# Patient Record
Sex: Female | Born: 1935 | Race: White | Hispanic: No
Health system: Southern US, Community
[De-identification: ages and names within clinical notes are randomized; demographics above are authoritative.]

---

## 2013-11-26 ENCOUNTER — Inpatient Hospital Stay
Admission: AD | Admit: 2013-11-26 | Discharge: 2013-12-30 | Disposition: E | Payer: Self-pay | Source: Intra-hospital | Attending: Internal Medicine | Admitting: Internal Medicine

## 2013-11-26 ENCOUNTER — Other Ambulatory Visit (HOSPITAL_COMMUNITY): Payer: Self-pay

## 2013-11-26 DIAGNOSIS — N493 Fournier gangrene: Secondary | ICD-10-CM

## 2013-11-26 DIAGNOSIS — J9601 Acute respiratory failure with hypoxia: Secondary | ICD-10-CM

## 2013-11-26 MED ORDER — IOHEXOL 300 MG/ML  SOLN
50.0000 mL | Freq: Once | INTRAMUSCULAR | Status: AC | PRN
  Administered 2013-11-26: 35 mL via ORAL

## 2013-11-27 ENCOUNTER — Other Ambulatory Visit (HOSPITAL_COMMUNITY): Payer: Self-pay

## 2013-11-27 DIAGNOSIS — N493 Fournier gangrene: Secondary | ICD-10-CM

## 2013-11-27 DIAGNOSIS — J9601 Acute respiratory failure with hypoxia: Secondary | ICD-10-CM

## 2013-11-27 DIAGNOSIS — J9600 Acute respiratory failure, unspecified whether with hypoxia or hypercapnia: Secondary | ICD-10-CM

## 2013-11-27 LAB — BLOOD GAS, ARTERIAL
Acid-Base Excess: 8.1 mmol/L — ABNORMAL HIGH (ref 0.0–2.0)
BICARBONATE: 30.7 meq/L — AB (ref 20.0–24.0)
Drawn by: 36496
FIO2: 0.3 %
MECHVT: 450 mL
O2 Saturation: 99.9 %
PEEP/CPAP: 5 cmH2O
PH ART: 7.583 — AB (ref 7.350–7.450)
Patient temperature: 98.6
RATE: 16 resp/min
TCO2: 31.7 mmol/L (ref 0–100)
pCO2 arterial: 32.5 mmHg — ABNORMAL LOW (ref 35.0–45.0)
pO2, Arterial: 147 mmHg — ABNORMAL HIGH (ref 80.0–100.0)

## 2013-11-27 LAB — CBC
HCT: 23.5 % — ABNORMAL LOW (ref 36.0–46.0)
Hemoglobin: 7.4 g/dL — ABNORMAL LOW (ref 12.0–15.0)
MCH: 32.2 pg (ref 26.0–34.0)
MCHC: 31.5 g/dL (ref 30.0–36.0)
MCV: 102.2 fL — ABNORMAL HIGH (ref 78.0–100.0)
Platelets: 108 10*3/uL — ABNORMAL LOW (ref 150–400)
RBC: 2.3 MIL/uL — AB (ref 3.87–5.11)
RDW: 19.5 % — ABNORMAL HIGH (ref 11.5–15.5)
WBC: 9 10*3/uL (ref 4.0–10.5)

## 2013-11-27 LAB — COMPREHENSIVE METABOLIC PANEL
ALK PHOS: 80 U/L (ref 39–117)
ALT: 12 U/L (ref 0–35)
ANION GAP: 6 (ref 5–15)
AST: 24 U/L (ref 0–37)
Albumin: 1.7 g/dL — ABNORMAL LOW (ref 3.5–5.2)
BUN: 25 mg/dL — AB (ref 6–23)
CO2: 31 meq/L (ref 19–32)
Calcium: 8.8 mg/dL (ref 8.4–10.5)
Chloride: 105 mEq/L (ref 96–112)
Creatinine, Ser: 0.33 mg/dL — ABNORMAL LOW (ref 0.50–1.10)
Glucose, Bld: 134 mg/dL — ABNORMAL HIGH (ref 70–99)
Potassium: 4.3 mEq/L (ref 3.7–5.3)
SODIUM: 142 meq/L (ref 137–147)
Total Bilirubin: 1.4 mg/dL — ABNORMAL HIGH (ref 0.3–1.2)
Total Protein: 4.9 g/dL — ABNORMAL LOW (ref 6.0–8.3)

## 2013-11-27 LAB — URINALYSIS, ROUTINE W REFLEX MICROSCOPIC
Bilirubin Urine: NEGATIVE
Glucose, UA: NEGATIVE mg/dL
Ketones, ur: NEGATIVE mg/dL
NITRITE: NEGATIVE
PROTEIN: 30 mg/dL — AB
Specific Gravity, Urine: 1.015 (ref 1.005–1.030)
Urobilinogen, UA: 4 mg/dL — ABNORMAL HIGH (ref 0.0–1.0)
pH: 8.5 — ABNORMAL HIGH (ref 5.0–8.0)

## 2013-11-27 LAB — URINE MICROSCOPIC-ADD ON

## 2013-11-27 LAB — PREALBUMIN: Prealbumin: 7.9 mg/dL — ABNORMAL LOW (ref 17.0–34.0)

## 2013-11-27 LAB — TSH: TSH: 11.31 u[IU]/mL — AB (ref 0.350–4.500)

## 2013-11-27 NOTE — Progress Notes (Signed)
[facility]                                                                                              Progress note     Patient Demographics  Angela Oneal, is a 80 y.o. female  UEA:[id number]  [id number]  DOB - 21-Sep-1930  Admit date - 2013/06/25  Admitting Physician Carron CurieAli Bobie Kistler, MD  Outpatient Primary MD for the patient is No primary provider on file.  LOS - 1   Chief complaint Respiratory failure Protein calorie malnutrition Wounds /Fournier's gangrene        Subjective:   Angela RobinsonsLillie Oneal cannot give any history  Objective:   Vital signs  Temperature 96 Heart rate 82 Respiratory rate 24 Blood pressure 138/77 Pulse ox 100%    Exam Obtunded/comatose .AT,  Stiff Neck,No JVD, No cervical lymphadenopathy appriciated. Tracheostomy midline Symmetrical Chest wall movement, scattered rhonchi, Irregularly irregular,No Gallops,Rubs systolic ejection murmur, No Parasternal Heave +ve B.Sounds, Abd Soft, generalized tenderness , No organomegaly appriciated, No rebound - guarding or rigidity. No Cyanosis, anasarca noted, multiple wounds reviewed   I&Os -410    Data Review   CBC  Recent Labs Lab 11/27/13 0820  WBC 9.0  HGB 7.4*  HCT 23.5*  PLT 108*  MCV 102.2*  MCH 32.2  MCHC 31.5  RDW 19.5*    Chemistries   Recent Labs Lab 11/27/13 0820  NA 142  K 4.3  CL 105  CO2 31  GLUCOSE 134*  BUN 25*  CREATININE 0.33*  CALCIUM 8.8  AST 24  ALT 12  ALKPHOS 80  BILITOT 1.4*   ------------------------------------------------------------------------------------------------------------------ CrCl is unknown because there is no height on file for the current visit. ------------------------------------------------------------------------------------------------------------------ No results found for this basename: HGBA1C,  in the last 72  hours ------------------------------------------------------------------------------------------------------------------ No results found for this basename: CHOL, HDL, LDLCALC, TRIG, CHOLHDL, LDLDIRECT,  in the last 72 hours ------------------------------------------------------------------------------------------------------------------  Recent Labs  11/27/13 0820  TSH 11.310*   ------------------------------------------------------------------------------------------------------------------ No results found for this basename: VITAMINB12, FOLATE, FERRITIN, TIBC, IRON, RETICCTPCT,  in the last 72 hours  Coagulation profile No results found for this basename: INR, PROTIME,  in the last 168 hours  No results found for this basename: DDIMER,  in the last 72 hours  Cardiac Enzymes No results found for this basename: CK, CKMB, TROPONINI, MYOGLOBIN,  in the last 168 hours ------------------------------------------------------------------------------------------------------------------ No components found with this basename: POCBNP,   Micro Results No results found for this or any previous visit (from the past 240 hour(s)).     Assessment & Plan   VDRF, status post tracheostomy on 7/24; complicated by pulmonary hypertension and acute lung injury due to sepsis Fournier's gangrene with involvement of the left perineum vulva right proximal medial thigh and groin status post multiple surgeries for debridement wound care team is following and wound VAC was placed Multiple decubitus ulcers on the right thigh and left ischium wound care team is following  protein calorie malnutrition/dysphagia status post PEG placement Aspiration pneumonia we'll continue with IV antibiotics UTI on treatment Status resolved with coagulopathy DIC and anemia status post blood products transfusion  A. fib with controlled rate History of congestive heart failure/diastolic with mitral  regurgitation Obesity Metabolic encephalopathy Generalized weakness PT OT might be limited due to condition Free air under diaphragm probably due to PEG tube placement and multiple surgeries. Not a surgical candidate at this time we'll follow up on an x-ray in a.m.  Plan  Continue IV antibiotics Check abdominal x-ray in am  Code Status: Full   DVT Prophylaxis  SCDs   Carron Curie M.D on 11/27/2013 at 12:19 PM

## 2013-11-27 NOTE — Consult Note (Signed)
Name: Angela Oneal MRN: [id number] DOB: 04-08-1931    ADMISSION DATE:  11/22/2013 CONSULTATION DATE:  7/30 REFERRING MD :  Laren Everts  PRIMARY SERVICE:  [facility]   CHIEF COMPLAINT:   Vent weaning   BRIEF PATIENT DESCRIPTION:  This is a MO 80 year old female who was found at home w/ AMS and multiple oozing body wounds. Per outside hospital report she was covered in stool, and clearly in a neglected state. She was admitted, her multiple wounds were assessed,  IV antibiotics and  wound care was initiated. Course was complicated by Septic shock, ARDS w/ respiratory failure, adrenal insuff, Acute encephalopathy, and DIC. Hemodynamics were eventually stabilized, but she failed weaning effort. Was transferred to [facility] for weaning efforts, wound care, antibiotics and rehab efforts.   SIGNIFICANT EVENTS / STUDIES:     HISTORY OF PRESENT ILLNESS:     80 year old female found at home w/ altered MS and numerous oozing body wounds. Admitted to [facility] for fourier gangrene involving left left perineum, vulva, and Right groin abscess. Her course was complicated by Septic shock, ARDS w/ respiratory failure, adrenal insuff, Acute encephalopathy, and DIC. He cultures from her wound grew back: staph aureus and Morganella morganii (from a right heel wound), Bacteroides caccae (beta lactamase positive) from right thigh and multiple organisms from her buttocks wound. Her hemodynamics were eventually stabalized, but she failed weaning effort. On 7/24 when she went for her wound clean-outs she also had trach and PEG placed. Wound Vac was initiated to right groin and packed Aquacel AG dressing to the vulvar left ischial wound. She remained off pressors but was profoundly deconditioned, and remained encephalopathic. Was transferred to [facility] for weaning efforts, wound care, antibiotics and rehab efforts.   PAST MEDICAL HISTORY :  Obesity  CHF Atrial fibrillation   Prior to Admission medications   pepcid 66m/d, flagyl 5067mvia  tube q8, rocephin 1 gm/d, solucortef 2533m 6, PRN albuterol neb, senna Plus bid, prednisone 24m50mbe    Allergies PCN   FAMILY HISTORY:  No family history on file. SOCIAL HISTORY:  has no tobacco, alcohol, and drug history on file.  REVIEW OF SYSTEMS:   Unable   SUBJECTIVE:  Chronically ill appearing female, currently on full vent support  VITAL SIGNS:  reviewed   PHYSICAL EXAMINATION: General:  Chronically ill appearing female. Currently on full vent support  Neuro:  Awake, does not f/c. Generalized weakness, non-focal  HEENT:  Temporal wasting. #6 trach unremarkable.  Cardiovascular:  Regular irregular  Lungs:  Scattered rhonchi  Abdomen:  PEG unremarkable. Wound vac draining and intact from right ant thigh. Abd somewhat tender to touch  Musculoskeletal:  Weak, generalized anasarca  Skin:  Multiple wounds to LE, to numerous to count left groin dressed   Recent Labs Lab 11/27/13 0820  NA 142  K 4.3  CL 105  CO2 31  BUN 25*  CREATININE 0.33*  GLUCOSE 134*    Recent Labs Lab 11/27/13 0820  HGB 7.4*  HCT 23.5*  WBC 9.0  PLT PENDING   ABG    Component Value Date/Time   PHART 7.583* 11/27/2013 0452   PCO2ART 32.5* 11/27/2013 0452   PO2ART 147.0* 11/27/2013 0452   HCO3 30.7* 11/27/2013 0452   TCO2 31.7 11/27/2013 0452   O2SAT 99.9 11/27/2013 0452    Dg Chest Port 1 View  11/27/2013   CLINICAL DATA:  Respiratory failure  EXAM: PORTABLE CHEST - 1 VIEW  COMPARISON:  Abdominal radiograph 11/24/2013  FINDINGS: Marked cardiomegaly with enlargement of the left heart and left atrial appendage. Atherosclerotic calcifications present in the transverse aorta. Pulmonary vascular congestion with cephalization and moderate pulmonary edema. Small amount of fluid present within the left major fissure. The patient is rotated toward the left. A left IJ central venous catheter is present. The catheter tip projects over the superior cavoatrial junction. Tracheostomy tube tip is midline  an well above the carina. Dense left retrocardiac opacity. Free air under the right hemidiaphragm.  IMPRESSION: 1. Large free air noted under the right hemidiaphragm. Free air was also noted on the abdominal radiograph performed yesterday. It is possible that this is related to the presence of the percutaneous gastrostomy tube. However, clinical correlation is recommended to assess for perforated viscus. 2. CHF. 3. Cardiomegaly with enlargement of the left atrial appendage and left heart. 4. Dense left retrocardiac opacity favored to reflect a combination of the cardiac and mediastinal structures given the rotated position of the patient, dependent edema and perhaps a small effusion. Superimposed infiltrate/pneumonia would be difficult to exclude entirely. 5. Satisfactory position of support apparatus as above. Critical Value/emergent results were called by telephone at the time of interpretation on 11/27/2013 at 7:56 am to the patient's nurse, Worthy Keeler , who verbally acknowledged these results.   Electronically Signed   By: Jacqulynn Cadet M.D.   On: 11/27/2013 07:57   Dg Abd Portable 1v  11/01/2013   ADDENDUM REPORT: 11/17/2013 23:53  ADDENDUM: Critical Value/emergent results were called by telephone at the time of interpretation on 10/29/2013 at 11:52 pm to Juanda Crumble, Stebbins , who verbally acknowledged these results.   Electronically Signed   By: Rozetta Nunnery M.D.   On: 11/14/2013 23:53   11/19/2013   CLINICAL DATA:  PICC placement.  EXAM: PORTABLE ABDOMEN - 1 VIEW  COMPARISON:  None.  FINDINGS: Peg tube is in the stomach. The injected contrast is in the stomach. There is some contrast in the distal colon as well. There is evidence of free air in the abdomen.  IMPRESSION: Free air in the abdomen.  Injected contrast is in the stomach.  Electronically Signed: By: Rozetta Nunnery M.D. On: 11/01/2013 23:07    ASSESSMENT / PLAN:  Resolved issues Septic shock Sepsis related coagulopathy  UTI Atrial fib    Active issues Acute respiratory failure ALI/ARDS Failure to wean  Tracheostomy status (placed 7/24) Fournier gangrene Left perineum/ vulvar area as well as right proximal/medial thigh/groin  Anemia of critical illness Acute encephalopathy  Protein cal malnutrition  Residual free air on CXR and flat plate abd 0/35: suspect this is residual from PEG and RLQ abd I&D of her complicated Obesity  Deconditioning   Discussion  This is a debilitated 80 year old female s/p a prolonged critical illness after admission for septic shock in setting of fournier gangrene c/b ALI/Ards, encephalopathy and MODS. She is profoundly deconditions, has multiple wounds and residual ALI +/- element of edema. She has failed efforts at weaning at [facility]. She is at high risk for prolonged ventilation and failure to wean.   Plan/rec: Cont abx per primary team  Cont wound care (vac for right groin and dressings to left perineal area) Begin weaning efforts, as tolerated.. Need to reduce resting RR (given resp alk) Wean protocol PS 12 start Consider diuresis (however currently bicarb > 30 may limit this) Cont nutritional support Cont rehab efforts.   May in fact need some TV reduction after further eval ideal body wt likely needs pain  control to improve rate on vent  Lavon Paganini. Titus Mould, MD, FACP Pgr: [facility]  Pulmonary and [place] Pager: [phone]  11/27/2013, 9:34 AM

## 2013-11-28 ENCOUNTER — Other Ambulatory Visit (HOSPITAL_COMMUNITY): Payer: Self-pay

## 2013-11-28 LAB — BLOOD GAS, ARTERIAL
Acid-Base Excess: 8.1 mmol/L — ABNORMAL HIGH (ref 0.0–2.0)
BICARBONATE: 31 meq/L — AB (ref 20.0–24.0)
FIO2: 0.3 %
LHR: 10 {breaths}/min
O2 SAT: 97.9 %
PEEP/CPAP: 5 cmH2O
Patient temperature: 98.6
TCO2: 32 mmol/L (ref 0–100)
VT: 400 mL
pCO2 arterial: 34.3 mmHg — ABNORMAL LOW (ref 35.0–45.0)
pH, Arterial: 7.563 — ABNORMAL HIGH (ref 7.350–7.450)
pO2, Arterial: 79.4 mmHg — ABNORMAL LOW (ref 80.0–100.0)

## 2013-11-28 NOTE — Progress Notes (Addendum)
[facility]                                                                                              Progress note     Patient Demographics  Glyn Zendejas, is a 80 y.o. female  ZOX:[id number]  [id number]  DOB - 12-May-1930  Admit date - 2013-12-03  Admitting Physician Carron Curie, MD  Outpatient Primary MD for the patient is No primary provider on file.  LOS - 2   Chief complaint Respiratory failure Protein calorie malnutrition Wounds /Fournier's gangrene        Subjective:   [name] cannot give any history  Objective:   Vital signs  Temperature 97.6 Heart rate 74 Respiratory rate 20 Blood pressure 102/60 Pulse ox 90%    Exam Obtunded/comatose [place].AT,  Stiff Neck,No JVD, No cervical lymphadenopathy appriciated. Tracheostomy midline Symmetrical Chest wall movement, scattered rhonchi, Irregularly irregular,No Gallops,Rubs systolic ejection murmur, No Parasternal Heave +ve B.Sounds, Abd Soft, generalized tenderness , No organomegaly appriciated, No rebound - guarding or rigidity. No Cyanosis, anasarca noted, multiple wounds reviewed   I&Os +282    Data Review   CBC  Recent Labs Lab 11/27/13 0820  WBC 9.0  HGB 7.4*  HCT 23.5*  PLT 108*  MCV 102.2*  MCH 32.2  MCHC 31.5  RDW 19.5*    Chemistries   Recent Labs Lab 11/27/13 0820  NA 142  K 4.3  CL 105  CO2 31  GLUCOSE 134*  BUN 25*  CREATININE 0.33*  CALCIUM 8.8  AST 24  ALT 12  ALKPHOS 80  BILITOT 1.4*   ------------------------------------------------------------------------------------------------------------------ CrCl is unknown because there is no height on file for the current visit. ------------------------------------------------------------------------------------------------------------------ No results found for this basename: HGBA1C,  in the last 72  hours ------------------------------------------------------------------------------------------------------------------ No results found for this basename: CHOL, HDL, LDLCALC, TRIG, CHOLHDL, LDLDIRECT,  in the last 72 hours ------------------------------------------------------------------------------------------------------------------  Recent Labs  11/27/13 0820  TSH 11.310*   ------------------------------------------------------------------------------------------------------------------ No results found for this basename: VITAMINB12, FOLATE, FERRITIN, TIBC, IRON, RETICCTPCT,  in the last 72 hours  Coagulation profile No results found for this basename: INR, PROTIME,  in the last 168 hours  No results found for this basename: DDIMER,  in the last 72 hours  Cardiac Enzymes No results found for this basename: CK, CKMB, TROPONINI, MYOGLOBIN,  in the last 168 hours ------------------------------------------------------------------------------------------------------------------ No components found with this basename: POCBNP,   Micro Results No results found for this or any previous visit (from the past 240 hour(s)).     Assessment & Plan   VDRF, status post tracheostomy on 7/24; complicated by pulmonary hypertension and acute lung injury due to sepsis Continue weaning per pulmonary Fournier's gangrene with involvement of the left perineum vulva right proximal medial thigh and groin status post multiple surgeries for debridement wound care team is following and wound VAC was placed Multiple decubitus ulcers on the right thigh and left ischium wound care team is following  protein calorie malnutrition/dysphagia status post PEG placement Aspiration pneumonia we'll continue with IV antibiotics UTI on treatment Status resolved with coagulopathy DIC and anemia status  post blood products transfusion A. fib with controlled rate History of congestive heart failure/diastolic with  mitral regurgitation Obesity Metabolic encephalopathy Generalized weakness PT OT might be limited due to condition Free air under diaphragm probably due to PEG tube placement and multiple surgeries. Not a surgical candidate at this time we'll follow up on an x-ray in a.m.  Plan  Check ABGs and BMP in a.m.  Code Status: Full   DVT Prophylaxis  SCDs   Carron CurieHijazi, Linzie Boursiquot M.D on 11/28/2013 at 12:33 PM

## 2013-11-28 NOTE — Consult Note (Signed)
Name: Angela Oneal MRN: [id number] DOB: [birth year]    ADMISSION DATE:  2013/12/08 CONSULTATION DATE:  7/30 REFERRING MD :  Sharyon Medicus  PRIMARY SERVICE:  [facility]   CHIEF COMPLAINT:   Vent weaning   BRIEF PATIENT DESCRIPTION:  This is a MO 80 year old female who was found at home w/ AMS and multiple oozing body wounds. Per outside hospital report she was covered in stool, and clearly in a neglected state. She was admitted, her multiple wounds were assessed,  IV antibiotics and  wound care was initiated. Course was complicated by Septic shock, ARDS w/ respiratory failure, adrenal insuff, Acute encephalopathy, and DIC. Hemodynamics were eventually stabilized, but she failed weaning effort. Was transferred to [facility] for weaning efforts, wound care, antibiotics and rehab efforts.  SIGNIFICANT EVENTS / STUDIES:  7/31 - weaning ps 12  SUBJECTIVE:  Chronically ill appearing female, currently on full vent support  VITAL SIGNS:  reviewed with weaning in progress  PHYSICAL EXAMINATION: General:  Chronically ill appearing female. Currently weaning Neuro:  Awake,Generalized weakness, non-focal  HEENT:  Temporal wasting. #6 trach unremarkable. dry Cardiovascular:  s1 s2 RRI Lungs:  Coarse throughout, distant   Abdomen:  PEG unremarkable. Wound vac draining and intact from right ant thigh. nt obese  Musculoskeletal:  Weak, generalized anasarca  Skin:  Multiple wounds to LE, to numerous to count left groin dressed   Recent Labs Lab 11/27/13 0820  NA 142  K 4.3  CL 105  CO2 31  BUN 25*  CREATININE 0.33*  GLUCOSE 134*    Recent Labs Lab 11/27/13 0820  HGB 7.4*  HCT 23.5*  WBC 9.0  PLT 108*   ABG    Component Value Date/Time   PHART 7.583* 11/27/2013 0452   PCO2ART 32.5* 11/27/2013 0452   PO2ART 147.0* 11/27/2013 0452   HCO3 30.7* 11/27/2013 0452   TCO2 31.7 11/27/2013 0452   O2SAT 99.9 11/27/2013 0452    Dg Chest Port 1 View  11/27/2013   CLINICAL DATA:  Respiratory failure  EXAM:  PORTABLE CHEST - 1 VIEW  COMPARISON:  Abdominal radiograph 12-08-13  FINDINGS: Marked cardiomegaly with enlargement of the left heart and left atrial appendage. Atherosclerotic calcifications present in the transverse aorta. Pulmonary vascular congestion with cephalization and moderate pulmonary edema. Small amount of fluid present within the left major fissure. The patient is rotated toward the left. A left IJ central venous catheter is present. The catheter tip projects over the superior cavoatrial junction. Tracheostomy tube tip is midline an well above the carina. Dense left retrocardiac opacity. Free air under the right hemidiaphragm.  IMPRESSION: 1. Large free air noted under the right hemidiaphragm. Free air was also noted on the abdominal radiograph performed yesterday. It is possible that this is related to the presence of the percutaneous gastrostomy tube. However, clinical correlation is recommended to assess for perforated viscus. 2. CHF. 3. Cardiomegaly with enlargement of the left atrial appendage and left heart. 4. Dense left retrocardiac opacity favored to reflect a combination of the cardiac and mediastinal structures given the rotated position of the patient, dependent edema and perhaps a small effusion. Superimposed infiltrate/pneumonia would be difficult to exclude entirely. 5. Satisfactory position of support apparatus as above. Critical Value/emergent results were called by telephone at the time of interpretation on 11/27/2013 at 7:56 am to the patient's nurse, Higinio Plan , who verbally acknowledged these results.   Electronically Signed   By: Malachy Moan M.D.   On: 11/27/2013 07:57   Dg  Abd Portable 1v  11/28/2013   CLINICAL DATA:  Free intraperitoneal air.  EXAM: PORTABLE ABDOMEN - 1 VIEW  COMPARISON:  Chest x-ray 11/27/2013.  KUB 11/24/2013.  FINDINGS: Soft tissue structures are unremarkable. Contrast is noted throughout the colon. Previously identified gastrostomy tube is not  visualized. Air under the right hemidiaphragm cannot be excluded as noted on prior studies. The patient's caregivers had been notified of this finding. No acute bony abnormality .  IMPRESSION: 1. Persistent and the right hemidiaphragm cannot be excluded as noted on prior studies. The patient's caregivers have been previously notified of this finding. 2. Previously identified gastrostomy tube is no longer identified. Contrast is noted throughout the colon.   Electronically Signed   By: Maisie Fushomas  Register   On: 11/28/2013 07:45   Dg Abd Portable 1v  10/31/2013   ADDENDUM REPORT: 11/14/2013 23:53  ADDENDUM: Critical Value/emergent results were called by telephone at the time of interpretation on 11/15/2013 at 11:52 pm to Leonette Mostharles, RN , who verbally acknowledged these results.   Electronically Signed   By: Geanie CooleyJim  Maxwell M.D.   On: 11/17/2013 23:53   11/10/2013   CLINICAL DATA:  PICC placement.  EXAM: PORTABLE ABDOMEN - 1 VIEW  COMPARISON:  None.  FINDINGS: Peg tube is in the stomach. The injected contrast is in the stomach. There is some contrast in the distal colon as well. There is evidence of free air in the abdomen.  IMPRESSION: Free air in the abdomen.  Injected contrast is in the stomach.  Electronically Signed: By: Geanie CooleyJim  Maxwell M.D. On: 10/29/2013 23:07    ASSESSMENT / PLAN:  Resolved issues Septic shock Sepsis related coagulopathy  UTI Atrial fib   Active issues Acute respiratory failure ALI/ARDS Failure to wean  Tracheostomy status (placed 7/24) Fournier gangrene Left perineum/ vulvar area as well as right proximal/medial thigh/groin  Anemia of critical illness Acute encephalopathy  Protein cal malnutrition  Residual free air on CXR and flat plate abd 1/617/30: suspect this is residual from PEG and RLQ abd I&D of her complicated Obesity  Deconditioning   Discussion  This is a debilitated 80 year old female s/p a prolonged critical illness after admission for septic shock in setting of  fournier gangrene c/b ALI/Ards, encephalopathy and MODS. She is profoundly deconditions, has multiple wounds and residual ALI +/- element of edema. She [facility]. She is at high risk for prolonged ventilation and failure to wean.   Plan/rec: Weaning ps 12 cpap 5, goal 4-6 hours Was alkalotic yesterday, rate reduced, ABG when back on rest and assess needs for TV reduction to 350 given ideal body wt likely needs pain control to improve rate on vent Would suggest pcxr Monday, also to follow free air post PEG for resolution should not have free air at 2 weeks post peg Repeat chem with active diuresis to [employer]  Daniel J. Tyson AliasFeinstein, MD, FACP Pgr: [phone] [place] [facility]  Pulmonary and Critical Care Medicine [facility] Pager: [phone]  11/28/2013, 10:01 AM

## 2013-11-29 ENCOUNTER — Other Ambulatory Visit (HOSPITAL_COMMUNITY): Payer: Self-pay

## 2013-11-29 LAB — BLOOD GAS, ARTERIAL
Acid-Base Excess: 9 mmol/L — ABNORMAL HIGH (ref 0.0–2.0)
Bicarbonate: 32.2 mEq/L — ABNORMAL HIGH (ref 20.0–24.0)
FIO2: 0.3 %
LHR: 10 {breaths}/min
MECHVT: 350 mL
O2 Saturation: 100 %
PCO2 ART: 36.2 mmHg (ref 35.0–45.0)
PEEP: 5 cmH2O
Patient temperature: 97.9
TCO2: 33.3 mmol/L (ref 0–100)
pH, Arterial: 7.556 — ABNORMAL HIGH (ref 7.350–7.450)
pO2, Arterial: 163 mmHg — ABNORMAL HIGH (ref 80.0–100.0)

## 2013-11-29 LAB — BASIC METABOLIC PANEL
Anion gap: 7 (ref 5–15)
BUN: 29 mg/dL — ABNORMAL HIGH (ref 6–23)
CALCIUM: 8.4 mg/dL (ref 8.4–10.5)
CO2: 30 mEq/L (ref 19–32)
Chloride: 104 mEq/L (ref 96–112)
Creatinine, Ser: 0.33 mg/dL — ABNORMAL LOW (ref 0.50–1.10)
GFR calc Af Amer: >90 mL/min (ref >90)
GLUCOSE: 137 mg/dL — AB (ref 70–99)
Potassium: 4.4 mEq/L (ref 3.7–5.3)
SODIUM: 141 meq/L (ref 137–147)

## 2013-11-29 MED ORDER — IOHEXOL 300 MG/ML  SOLN
150.0000 mL | Freq: Once | INTRAMUSCULAR | Status: AC | PRN
  Administered 2013-11-29: 150 mL via ORAL

## 2013-11-29 DEATH — deceased

## 2013-12-01 ENCOUNTER — Other Ambulatory Visit (HOSPITAL_COMMUNITY): Payer: Self-pay

## 2013-12-01 LAB — BASIC METABOLIC PANEL
Anion gap: 4 — ABNORMAL LOW (ref 5–15)
BUN: 33 mg/dL — AB (ref 6–23)
CO2: 32 mEq/L (ref 19–32)
CREATININE: 0.29 mg/dL — AB (ref 0.50–1.10)
Calcium: 8.2 mg/dL — ABNORMAL LOW (ref 8.4–10.5)
Chloride: 105 mEq/L (ref 96–112)
GFR calc Af Amer: >90 mL/min (ref >90)
GLUCOSE: 120 mg/dL — AB (ref 70–99)
Potassium: 4.3 mEq/L (ref 3.7–5.3)
Sodium: 141 mEq/L (ref 137–147)

## 2013-12-01 LAB — CBC
HEMATOCRIT: 22.5 % — AB (ref 36.0–46.0)
Hemoglobin: 7 g/dL — ABNORMAL LOW (ref 12.0–15.0)
MCH: 32.7 pg (ref 26.0–34.0)
MCHC: 31.1 g/dL (ref 30.0–36.0)
MCV: 105.1 fL — AB (ref 78.0–100.0)
Platelets: 104 10*3/uL — ABNORMAL LOW (ref 150–400)
RBC: 2.14 MIL/uL — ABNORMAL LOW (ref 3.87–5.11)
RDW: 19.9 % — AB (ref 11.5–15.5)
WBC: 8.7 10*3/uL (ref 4.0–10.5)

## 2013-12-01 LAB — ABO/RH: ABO/RH(D): O NEG

## 2013-12-01 LAB — PREPARE RBC (CROSSMATCH)
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
## 2013-12-01 NOTE — Progress Notes (Signed)
[facility]                                                                                              Progress note     Patient Demographics  Angela RobinsonsLillie Stradford, is a 80 y.o. female  ZOX:[id number]  WJX:[id number]  DOB - 1930-11-06  Admit date - 11/09/2013  Admitting Physician Carron CurieAli Kage Willmann, MD  Outpatient Primary MD for the patient is No primary provider on file.  LOS - 5   Chief complaint Respiratory failure Protein calorie malnutrition Wounds /Fournier's gangrene        Subjective:   Angela Oneal cannot give any history  Objective:   Vital signs  Temperature 98.1 Heart rate 100 Respiratory rate 34 Blood pressure 180/69 was normal earlier will monitor Pulse ox 98%    Exam Obtunded/comatose [place].AT,  Stiff Neck,No JVD, No cervical lymphadenopathy appriciated. Tracheostomy midline Symmetrical Chest wall movement, scattered rhonchi, Irregularly irregular,No Gallops,Rubs systolic ejection murmur, No Parasternal Heave +ve B.Sounds, Abd Soft, generalized tenderness , No organomegaly appriciated, No rebound - guarding or rigidity. No Cyanosis, anasarca noted, multiple wounds reviewed   I&Os -490    Data Review   CBC  Recent Labs Lab 11/27/13 0820 12/01/13 0555  WBC 9.0 8.7  HGB 7.4* 7.0*  HCT 23.5* 22.5*  PLT 108* 104*  MCV 102.2* 105.1*  MCH 32.2 32.7  MCHC 31.5 31.1  RDW 19.5* 19.9*    Chemistries   Recent Labs Lab 11/27/13 0820 11/29/13 0520 12/01/13 0555  NA 142 141 141  K 4.3 4.4 4.3  CL 105 104 105  CO2 31 30 32  GLUCOSE 134* 137* 120*  BUN 25* 29* 33*  CREATININE 0.33* 0.33* 0.29*  CALCIUM 8.8 8.4 8.2*  AST 24  --   --   ALT 12  --   --   ALKPHOS 80  --   --   BILITOT 1.4*  --   --    ------------------------------------------------------------------------------------------------------------------ CrCl is unknown because there is no height on file  for the current visit. ------------------------------------------------------------------------------------------------------------------ No results found for this basename: HGBA1C,  in the last 72 hours ------------------------------------------------------------------------------------------------------------------ No results found for this basename: CHOL, HDL, LDLCALC, TRIG, CHOLHDL, LDLDIRECT,  in the last 72 hours ------------------------------------------------------------------------------------------------------------------ No results found for this basename: TSH, T4TOTAL, FREET3, T3FREE, THYROIDAB,  in the last 72 hours ------------------------------------------------------------------------------------------------------------------ No results found for this basename: VITAMINB12, FOLATE, FERRITIN, TIBC, IRON, RETICCTPCT,  in the last 72 hours  Coagulation profile No results found for this basename: INR, PROTIME,  in the last 168 hours  No results found for this basename: DDIMER,  in the last 72 hours  Cardiac Enzymes No results found for this basename: CK, CKMB, TROPONINI, MYOGLOBIN,  in the last 168 hours ------------------------------------------------------------------------------------------------------------------ No components found with this basename: POCBNP,   Micro Results No results found for this or any previous visit (from the past 240 hour(s)).     Assessment & Plan   VDRF, status post tracheostomy on 7/24; complicated by pulmonary hypertension and acute lung injury due to sepsis Continue weaning per pulmonary Fournier's gangrene with involvement of the left  perineum vulva right proximal medial thigh and groin status post multiple surgeries for debridement wound care team is following and wound VAC was placed Multiple decubitus ulcers on the right thigh and left ischium wound care team is following  protein calorie malnutrition/dysphagia status post PEG  placement Aspiration pneumonia we'll continue with IV antibiotics UTI on treatment Status resolved with coagulopathy DIC and anemia status post blood products transfusion A. fib with controlled rate History of congestive heart failure/diastolic with mitral regurgitation Obesity Metabolic encephalopathy Generalized weakness PT OT might be limited due to condition Free air under diaphragm probably due to PEG tube placement and multiple surgeries. Patient is asymptomatic. Not a surgical candidate at this time we'll follow up on an x-ray in a.m. Anemia  Plan  Blood transfusion Continue weaning . Follow abdominal free air  Code Status: Full   DVT Prophylaxis  SCDs   Carron Curie M.D on 12/01/2013 at 11:09 AM

## 2013-12-02 LAB — CBC
HEMATOCRIT: 25.9 % — AB (ref 36.0–46.0)
Hemoglobin: 8.3 g/dL — ABNORMAL LOW (ref 12.0–15.0)
MCH: 32.4 pg (ref 26.0–34.0)
MCHC: 32 g/dL (ref 30.0–36.0)
MCV: 101.2 fL — ABNORMAL HIGH (ref 78.0–100.0)
Platelets: 113 10*3/uL — ABNORMAL LOW (ref 150–400)
RBC: 2.56 MIL/uL — ABNORMAL LOW (ref 3.87–5.11)
RDW: 22.1 % — AB (ref 11.5–15.5)
WBC: 9.5 10*3/uL (ref 4.0–10.5)

## 2013-12-02 NOTE — Progress Notes (Addendum)
[facility]                                                                                              Progress note     Patient Demographics  Angela Oneal, is a 80 y.o. female  KKX:[id number]  [id number]  DOB - 04/19/1931  Admit date - 11-29-13  Admitting Physician Angela Curie, MD  Outpatient Primary MD for the patient is No primary provider on file.  LOS - 6   Chief complaint Respiratory failure Protein calorie malnutrition Wounds /Fournier's gangrene        Subjective:   Angela Oneal cannot give any history  Objective:   Vital signs  Temperature 96.9 Heart rate 70 Respiratory rate 19 Blood pressure 131/64 Pulse ox 97%    Exam Obtunded/comatose [place].AT,  Stiff Neck,No JVD, No cervical lymphadenopathy appriciated. Tracheostomy midline Symmetrical Chest wall movement, scattered rhonchi, Irregularly irregular,No Gallops,Rubs systolic ejection murmur, No Parasternal Heave +ve B.Sounds, Abd Soft, generalized tenderness , No organomegaly appriciated, No rebound - guarding or rigidity. No Cyanosis, anasarca noted, multiple wounds reviewed   I&Os - 720    Data Review   CBC  Recent Labs Lab 11/27/13 0820 12/01/13 0555  WBC 9.0 8.7  HGB 7.4* 7.0*  HCT 23.5* 22.5*  PLT 108* 104*  MCV 102.2* 105.1*  MCH 32.2 32.7  MCHC 31.5 31.1  RDW 19.5* 19.9*    Chemistries   Recent Labs Lab 11/27/13 0820 11/29/13 0520 12/01/13 0555  NA 142 141 141  K 4.3 4.4 4.3  CL 105 104 105  CO2 31 30 32  GLUCOSE 134* 137* 120*  BUN 25* 29* 33*  CREATININE 0.33* 0.33* 0.29*  CALCIUM 8.8 8.4 8.2*  AST 24  --   --   ALT 12  --   --   ALKPHOS 80  --   --   BILITOT 1.4*  --   --    ------------------------------------------------------------------------------------------------------------------ CrCl is unknown because there is no height on file for the current  visit. ------------------------------------------------------------------------------------------------------------------ No results found for this basename: HGBA1C,  in the last 72 hours ------------------------------------------------------------------------------------------------------------------ No results found for this basename: CHOL, HDL, LDLCALC, TRIG, CHOLHDL, LDLDIRECT,  in the last 72 hours ------------------------------------------------------------------------------------------------------------------ No results found for this basename: TSH, T4TOTAL, FREET3, T3FREE, THYROIDAB,  in the last 72 hours ------------------------------------------------------------------------------------------------------------------ No results found for this basename: VITAMINB12, FOLATE, FERRITIN, TIBC, IRON, RETICCTPCT,  in the last 72 hours  Coagulation profile No results found for this basename: INR, PROTIME,  in the last 168 hours  No results found for this basename: DDIMER,  in the last 72 hours  Cardiac Enzymes No results found for this basename: CK, CKMB, TROPONINI, MYOGLOBIN,  in the last 168 hours ------------------------------------------------------------------------------------------------------------------ No components found with this basename: POCBNP,   Micro Results No results found for this or any previous visit (from the past 240 hour(s)).     Assessment & Plan   VDRF, status post tracheostomy on 7/24; complicated by pulmonary hypertension and acute lung injury due to sepsis Continue PSV weaning per pulmonary Fournier's gangrene with involvement of the left perineum vulva right  proximal medial thigh and groin status post multiple surgeries for debridement wound care team is following and wound VAC was placed Multiple decubitus ulcers on the right thigh and left ischium wound care team is following  protein calorie malnutrition/dysphagia status post PEG placement Aspiration  pneumonia we'll continue with IV antibiotics UTI on treatment Status resolved with coagulopathy DIC and anemia status post blood products transfusion A. fib with controlled rate History of congestive heart failure/diastolic with mitral regurgitation Obesity Metabolic encephalopathy Generalized weakness PT OT might be limited due to condition Free air under diaphragm probably due to PEG tube placement and multiple surgeries. Patient is asymptomatic. Not a surgical candidate at this time we'll follow up on an x-ray in a.m. Anemia  Plan  Continue same treatment Check CBC stat Critical care time 37 minutes  Code Status: Full   DVT Prophylaxis  SCDs   Angela CurieHijazi, Angela Oneal M.D on 12/02/2013 at 11:58 AM

## 2013-12-03 ENCOUNTER — Other Ambulatory Visit (HOSPITAL_COMMUNITY): Payer: Self-pay

## 2013-12-03 LAB — TYPE AND SCREEN
ABO/RH(D): O NEG
Antibody Screen: NEGATIVE
UNIT DIVISION: 0
Unit division: 0

## 2013-12-03 LAB — CBC
HCT: 25.8 % — ABNORMAL LOW (ref 36.0–46.0)
HEMOGLOBIN: 8.2 g/dL — AB (ref 12.0–15.0)
MCH: 32.5 pg (ref 26.0–34.0)
MCHC: 31.8 g/dL (ref 30.0–36.0)
MCV: 102.4 fL — ABNORMAL HIGH (ref 78.0–100.0)
Platelets: 112 10*3/uL — ABNORMAL LOW (ref 150–400)
RBC: 2.52 MIL/uL — AB (ref 3.87–5.11)
RDW: 22.1 % — ABNORMAL HIGH (ref 11.5–15.5)
WBC: 8.9 10*3/uL (ref 4.0–10.5)

## 2013-12-03 NOTE — Progress Notes (Signed)
[facility]                                                                                              Progress note     Patient Demographics  Angela Oneal, is a 80 y.o. female  ZOX:[id number]  WJX:[id number]  DOB - 1930-08-16  Admit date - Dec 19, 2013  Admitting Physician Carron Curie, MD  Outpatient Primary MD for the patient is No primary provider on file.  LOS - 7   Chief complaint Respiratory failure Protein calorie malnutrition Wounds /Fournier's gangrene        Subjective:   Angela Oneal cannot give any history  Objective:   Vital signs  Temperature 97.3 Heart rate 76 Respiratory rate 19 Blood pressure 113/66 Pulse ox 97%    Exam More alert still confused Angela Oneal.AT,  Stiff Neck,No JVD, No cervical lymphadenopathy appriciated. Tracheostomy midline Symmetrical Chest wall movement, scattered rhonchi, Irregularly irregular,No Gallops,Rubs systolic ejection murmur, No Parasternal Heave +ve B.Sounds, Abd Soft, generalized tenderness , No organomegaly appriciated, No rebound - guarding or rigidity. No Cyanosis, anasarca noted, multiple wounds reviewed   I&Os - 561    Data Review   CBC  Recent Labs Lab 11/27/13 0820 12/01/13 0555 12/02/13 1120 12/03/13 0500  WBC 9.0 8.7 9.5 8.9  HGB 7.4* 7.0* 8.3* 8.2*  HCT 23.5* 22.5* 25.9* 25.8*  PLT 108* 104* 113* 112*  MCV 102.2* 105.1* 101.2* 102.4*  MCH 32.2 32.7 32.4 32.5  MCHC 31.5 31.1 32.0 31.8  RDW 19.5* 19.9* 22.1* 22.1*    Chemistries   Recent Labs Lab 11/27/13 0820 11/29/13 0520 12/01/13 0555  NA 142 141 141  K 4.3 4.4 4.3  CL 105 104 105  CO2 31 30 32  GLUCOSE 134* 137* 120*  BUN 25* 29* 33*  CREATININE 0.33* 0.33* 0.29*  CALCIUM 8.8 8.4 8.2*  AST 24  --   --   ALT 12  --   --   ALKPHOS 80  --   --   BILITOT 1.4*  --   --     ------------------------------------------------------------------------------------------------------------------ CrCl is unknown because there is no height on file for the current visit. ------------------------------------------------------------------------------------------------------------------ No results found for this basename: HGBA1C,  in the last 72 hours ------------------------------------------------------------------------------------------------------------------ No results found for this basename: CHOL, HDL, LDLCALC, TRIG, CHOLHDL, LDLDIRECT,  in the last 72 hours ------------------------------------------------------------------------------------------------------------------ No results found for this basename: TSH, T4TOTAL, FREET3, T3FREE, THYROIDAB,  in the last 72 hours ------------------------------------------------------------------------------------------------------------------ No results found for this basename: VITAMINB12, FOLATE, FERRITIN, TIBC, IRON, RETICCTPCT,  in the last 72 hours  Coagulation profile No results found for this basename: INR, PROTIME,  in the last 168 hours  No results found for this basename: DDIMER,  in the last 72 hours  Cardiac Enzymes No results found for this basename: CK, CKMB, TROPONINI, MYOGLOBIN,  in the last 168 hours ------------------------------------------------------------------------------------------------------------------ No components found with this basename: POCBNP,   Micro Results No results found for this or any previous visit (from the past 240 hour(s)).     Assessment & Plan   VDRF, status post tracheostomy on 7/24; complicated by pulmonary  hypertension and acute lung injury due to sepsis Continue ATC weaning Fournier's gangrene with involvement of the left perineum vulva right proximal medial thigh and groin status post multiple surgeries for debridement wound care team is following and wound VAC was  placed Multiple decubitus ulcers on the right thigh and left ischium wound care team is following  protein calorie malnutrition/dysphagia status post PEG placement Aspiration pneumonia we'll continue with IV antibiotics UTI on treatment Status resolved with coagulopathy DIC and anemia status post blood products transfusion A. fib with controlled rate History of congestive heart failure/diastolic with mitral regurgitation Obesity Metabolic encephalopathy Generalized weakness PT OT might be limited due to condition Free air under diaphragm probably due to PEG tube placement and multiple surgeries. Patient is asymptomatic. Not a surgical candidate at this time we'll follow up on an x-ray in a.m. Anemia  Plan  Check labs in a.m. ATC 24 hours today Consult surgery  Code Status: Full   DVT Prophylaxis  SCDs   Carron CurieHijazi, Nusrat Encarnacion M.D on 12/03/2013 at 12:19 PM

## 2013-12-04 LAB — BASIC METABOLIC PANEL
Anion gap: 7 (ref 5–15)
BUN: 40 mg/dL — ABNORMAL HIGH (ref 6–23)
CALCIUM: 8.3 mg/dL — AB (ref 8.4–10.5)
CHLORIDE: 102 meq/L (ref 96–112)
CO2: 31 meq/L (ref 19–32)
Creatinine, Ser: 0.29 mg/dL — ABNORMAL LOW (ref 0.50–1.10)
GFR calc Af Amer: >90 mL/min (ref >90)
GFR calc non Af Amer: >90 mL/min (ref >90)
Glucose, Bld: 139 mg/dL — ABNORMAL HIGH (ref 70–99)
Potassium: 4.2 mEq/L (ref 3.7–5.3)
SODIUM: 140 meq/L (ref 137–147)

## 2013-12-04 LAB — CBC
HEMATOCRIT: 26.4 % — AB (ref 36.0–46.0)
Hemoglobin: 8.4 g/dL — ABNORMAL LOW (ref 12.0–15.0)
MCH: 32.2 pg (ref 26.0–34.0)
MCHC: 31.8 g/dL (ref 30.0–36.0)
MCV: 101.1 fL — ABNORMAL HIGH (ref 78.0–100.0)
Platelets: 129 10*3/uL — ABNORMAL LOW (ref 150–400)
RBC: 2.61 MIL/uL — ABNORMAL LOW (ref 3.87–5.11)
RDW: 21.5 % — AB (ref 11.5–15.5)
WBC: 10.2 10*3/uL (ref 4.0–10.5)
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
## 2013-12-04 NOTE — Progress Notes (Signed)
[facility]                                                                                              Progress note     Patient Demographics  Angela Oneal, is a 80 y.o. female  ZOX:[id number]  WJX:[id number]  DOB - [birth year]  Admit date - 10/31/2013  Admitting Physician Carron CurieAli Mavrik Bynum, MD  Outpatient Primary MD for the patient is No primary provider on file.  LOS - 8   Chief complaint Respiratory failure Protein calorie malnutrition Wounds /Fournier's gangrene        Subjective:   Angela RobinsonsLillie Gadea cannot give any history  Objective:   Vital signs  Temperature 98 Heart rate 67 Respiratory rate 19 Blood pressure 121/67 Pulse ox 100%    Exam  More alert still confused [place].AT,  Stiff Neck,No JVD, No cervical lymphadenopathy appriciated. Tracheostomy midline Symmetrical Chest wall movement, scattered rhonchi, Irregularly irregular,No Gallops,Rubs systolic ejection murmur, No Parasternal Heave +ve B.Sounds, Abd Soft, generalized tenderness , No organomegaly appriciated, No rebound - guarding or rigidity. No Cyanosis, anasarca noted, multiple wounds reviewed   I&Os 97    Data Review   CBC  Recent Labs Lab 12/01/13 0555 12/02/13 1120 12/03/13 0500 12/04/13 0500  WBC 8.7 9.5 8.9 10.2  HGB 7.0* 8.3* 8.2* 8.4*  HCT 22.5* 25.9* 25.8* 26.4*  PLT 104* 113* 112* 129*  MCV 105.1* 101.2* 102.4* 101.1*  MCH 32.7 32.4 32.5 32.2  MCHC 31.1 32.0 31.8 31.8  RDW 19.9* 22.1* 22.1* 21.5*    Chemistries   Recent Labs Lab 11/29/13 0520 12/01/13 0555 12/04/13 0500  NA 141 141 140  K 4.4 4.3 4.2  CL 104 105 102  CO2 30 32 31  GLUCOSE 137* 120* 139*  BUN 29* 33* 40*  CREATININE 0.33* 0.29* 0.29*  CALCIUM 8.4 8.2* 8.3*   ------------------------------------------------------------------------------------------------------------------ CrCl is unknown because there is no height on  file for the current visit. ------------------------------------------------------------------------------------------------------------------ No results found for this basename: HGBA1C,  in the last 72 hours ------------------------------------------------------------------------------------------------------------------ No results found for this basename: CHOL, HDL, LDLCALC, TRIG, CHOLHDL, LDLDIRECT,  in the last 72 hours ------------------------------------------------------------------------------------------------------------------ No results found for this basename: TSH, T4TOTAL, FREET3, T3FREE, THYROIDAB,  in the last 72 hours ------------------------------------------------------------------------------------------------------------------ No results found for this basename: VITAMINB12, FOLATE, FERRITIN, TIBC, IRON, RETICCTPCT,  in the last 72 hours  Coagulation profile No results found for this basename: INR, PROTIME,  in the last 168 hours  No results found for this basename: DDIMER,  in the last 72 hours  Cardiac Enzymes No results found for this basename: CK, CKMB, TROPONINI, MYOGLOBIN,  in the last 168 hours ------------------------------------------------------------------------------------------------------------------ No components found with this basename: POCBNP,   Micro Results No results found for this or any previous visit (from the past 240 hour(s)).     Assessment & Plan   VDRF, status post tracheostomy on 7/24; complicated by pulmonary hypertension and acute lung injury due to sepsis Continue ATC weaning Fournier's gangrene with involvement of the left perineum vulva right proximal medial thigh and groin status post multiple surgeries for debridement wound care team is  following and wound VAC was placed wounds look improved Multiple decubitus ulcers on the right thigh and left ischium wound care team is following ulcers look improved  protein calorie  malnutrition/dysphagia status post PEG placement Aspiration pneumonia we'll continue with IV antibiotics UTI on treatment Status resolved with coagulopathy DIC and anemia status post blood products transfusion A. fib with controlled rate History of congestive heart failure/diastolic with mitral regurgitation Obesity Metabolic encephalopathy Generalized weakness PT OT might be limited due to condition Free air under diaphragm probably due to PEG tube placement and multiple surgeries. Patient is asymptomatic. Not a surgical candidate at this time we'll follow up on an x-ray in a.m. Anemia  Plan  Continue same treatment No change  Code Status: Full   DVT Prophylaxis  SCDs   Carron Curie M.D on 12/04/2013 at 2:15 PM

## 2013-12-05 NOTE — Progress Notes (Addendum)
[facility]                                                                                              Progress note     Patient Demographics  Angela Oneal, is a 80 y.o. female  JXB:[id number]  ZHY:[id number]  DOB - [birth year]  Admit date - 01-26-2014  Admitting Physician Carron CurieAli Jad Johansson, MD  Outpatient Primary MD for the patient is No primary provider on file.  LOS - 9   Chief complaint Respiratory failure Protein calorie malnutrition Wounds /Fournier's gangrene        Subjective:   Angela Oneal cannot give any history  Objective:   Vital signs  Temperature 98.9 Heart rate 65 Respiratory rate 22 Blood pressure 142/72 Pulse ox 100%    Exam  More alert still confused [place].AT,  Stiff Neck,No JVD, No cervical lymphadenopathy appriciated. Tracheostomy midline Symmetrical Chest wall movement, scattered rhonchi, Irregularly irregular,No Gallops,Rubs systolic ejection murmur, No Parasternal Heave +ve B.Sounds, Abd Soft, generalized tenderness , No organomegaly appriciated, No rebound - guarding or rigidity. No Cyanosis, anasarca noted, multiple wounds reviewed   I&Os 598    Data Review   CBC  Recent Labs Lab 12/01/13 0555 12/02/13 1120 12/03/13 0500 12/04/13 0500  WBC 8.7 9.5 8.9 10.2  HGB 7.0* 8.3* 8.2* 8.4*  HCT 22.5* 25.9* 25.8* 26.4*  PLT 104* 113* 112* 129*  MCV 105.1* 101.2* 102.4* 101.1*  MCH 32.7 32.4 32.5 32.2  MCHC 31.1 32.0 31.8 31.8  RDW 19.9* 22.1* 22.1* 21.5*    Chemistries   Recent Labs Lab 11/29/13 0520 12/01/13 0555 12/04/13 0500  NA 141 141 140  K 4.4 4.3 4.2  CL 104 105 102  CO2 30 32 31  GLUCOSE 137* 120* 139*  BUN 29* 33* 40*  CREATININE 0.33* 0.29* 0.29*  CALCIUM 8.4 8.2* 8.3*   ------------------------------------------------------------------------------------------------------------------ CrCl is unknown because there is no height  on file for the current visit. ------------------------------------------------------------------------------------------------------------------ No results found for this basename: HGBA1C,  in the last 72 hours ------------------------------------------------------------------------------------------------------------------ No results found for this basename: CHOL, HDL, LDLCALC, TRIG, CHOLHDL, LDLDIRECT,  in the last 72 hours ------------------------------------------------------------------------------------------------------------------ No results found for this basename: TSH, T4TOTAL, FREET3, T3FREE, THYROIDAB,  in the last 72 hours ------------------------------------------------------------------------------------------------------------------ No results found for this basename: VITAMINB12, FOLATE, FERRITIN, TIBC, IRON, RETICCTPCT,  in the last 72 hours  Coagulation profile No results found for this basename: INR, PROTIME,  in the last 168 hours  No results found for this basename: DDIMER,  in the last 72 hours  Cardiac Enzymes No results found for this basename: CK, CKMB, TROPONINI, MYOGLOBIN,  in the last 168 hours ------------------------------------------------------------------------------------------------------------------ No components found with this basename: POCBNP,   Micro Results No results found for this or any previous visit (from the past 240 hour(s)).     Assessment & Plan   VDRF, status post tracheostomy on 7/24; complicated by pulmonary hypertension and acute lung injury due to sepsis Continue ATC  Fournier's gangrene with involvement of the left perineum vulva right proximal medial thigh and groin status post multiple surgeries for debridement wound care team is  following and wound VAC was placed wounds look improved Multiple decubitus ulcers on the right thigh and left ischium wound care team is following ulcers look improved  protein calorie  malnutrition/dysphagia status post PEG placement Aspiration pneumonia we'll continue with IV antibiotics UTI on treatment Status resolved with coagulopathy DIC and anemia status post blood products transfusion A. fib with controlled rate History of congestive heart failure/diastolic with mitral regurgitation Obesity Metabolic encephalopathy Generalized weakness PT OT might be limited due to condition Free air under diaphragm probably due to PEG tube placement and multiple surgeries. Patient is asymptomatic. Not a surgical candidate at this time we'll follow up on an x-ray in a.m. Anemia  Plan  Continue same treatment No change Critical care time 31 minutes Code Status: Full   DVT Prophylaxis  SCDs   Carron Curie M.D on 12/05/2013 at 11:41 AM

## 2013-12-06 NOTE — Progress Notes (Signed)
[facility]                                                                                              Progress note     Patient Demographics  Angela Oneal, is a 80 y.o. female  ZOX:[id number]  [id number]:[id number]  DOB - 1930-12-18  Admit date - 11/23/2013  Admitting Physician Angela Curie, MD  Outpatient Primary MD for the patient is No primary provider on file.  LOS - 10   Chief complaint Respiratory failure Protein calorie malnutrition Wounds /Fournier's gangrene        Subjective:   Angela Oneal cannot give any history  Objective:   Vital signs  Temperature 97.1 Heart rate is 60 Respiratory rate 20 Blood pressure 112/60 Pulse ox 100%    Exam  More alert still confused [place].AT,  Stiff Neck,No JVD, No cervical lymphadenopathy appriciated. Tracheostomy midline Symmetrical Chest wall movement, scattered rhonchi, Regular rate and rhythm ,No Gallops,Rubs systolic ejection murmur, No Parasternal Heave +ve B.Sounds, Abd Soft, generalized tenderness , No organomegaly appriciated, No rebound - guarding or rigidity. No Cyanosis, anasarca noted, multiple wounds reviewed   I&Os 2060/210    Data Review   CBC  Recent Labs Lab 12/01/13 0555 12/02/13 1120 12/03/13 0500 12/04/13 0500  WBC 8.7 9.5 8.9 10.2  HGB 7.0* 8.3* 8.2* 8.4*  HCT 22.5* 25.9* 25.8* 26.4*  PLT 104* 113* 112* 129*  MCV 105.1* 101.2* 102.4* 101.1*  MCH 32.7 32.4 32.5 32.2  MCHC 31.1 32.0 31.8 31.8  RDW 19.9* 22.1* 22.1* 21.5*    Chemistries   Recent Labs Lab 12/01/13 0555 12/04/13 0500  NA 141 140  K 4.3 4.2  CL 105 102  CO2 32 31  GLUCOSE 120* 139*  BUN 33* 40*  CREATININE 0.29* 0.29*  CALCIUM 8.2* 8.3*   ------------------------------------------------------------------------------------------------------------------ CrCl is unknown because there is no height on file for the current  visit. ------------------------------------------------------------------------------------------------------------------ No results found for this basename: HGBA1C,  in the last 72 hours ------------------------------------------------------------------------------------------------------------------ No results found for this basename: CHOL, HDL, LDLCALC, TRIG, CHOLHDL, LDLDIRECT,  in the last 72 hours ------------------------------------------------------------------------------------------------------------------ No results found for this basename: TSH, T4TOTAL, FREET3, T3FREE, THYROIDAB,  in the last 72 hours ------------------------------------------------------------------------------------------------------------------ No results found for this basename: VITAMINB12, FOLATE, FERRITIN, TIBC, IRON, RETICCTPCT,  in the last 72 hours  Coagulation profile No results found for this basename: INR, PROTIME,  in the last 168 hours  No results found for this basename: DDIMER,  in the last 72 hours  Cardiac Enzymes No results found for this basename: CK, CKMB, TROPONINI, MYOGLOBIN,  in the last 168 hours ------------------------------------------------------------------------------------------------------------------ No components found with this basename: POCBNP,   Micro Results No results found for this or any previous visit (from the past 240 hour(s)).     Assessment & Plan   VDRF, status post tracheostomy on 7/24; complicated by pulmonary hypertension and acute lung injury due to sepsis Continue ATC  Fournier's gangrene with involvement of the left perineum vulva right proximal medial thigh and groin status post multiple surgeries for debridement wound care team is following and wound VAC was placed  wounds look improved Multiple decubitus ulcers on the right thigh and left ischium wound care team is following ulcers look improved  protein calorie malnutrition/dysphagia status post PEG  placement Aspiration pneumonia we'll continue with IV antibiotics UTI on treatment Status resolved with coagulopathy DIC and anemia status post blood products transfusion A. fib with controlled rate History of congestive heart failure/diastolic with mitral regurgitation Obesity Metabolic encephalopathy Generalized weakness PT OT might be limited due to condition Free air under diaphragm probably due to PEG tube placement and multiple surgeries. Patient is asymptomatic. Not a surgical candidate at this time we'll follow up on an x-ray in a.m. Anemia  Plan  Continue same treatment No change  Code Status: Full   DVT Prophylaxis  SCDs   Angela CurieHijazi, Angela Oneal M.D on 12/06/2013 at 3:09 PM

## 2013-12-07 NOTE — Progress Notes (Signed)
[facility]                                                                                              Progress note     Patient Demographics  Angela Oneal, is a 80 y.o. female  ZOX:[id number]  [id number]:[id number]  DOB - 30-Mar-1931  Admit date - Dec 19, 2013  Admitting Physician Carron Curie, MD  Outpatient Primary MD for the patient is No primary provider on file.  LOS - 11   Chief complaint Respiratory failure Protein calorie malnutrition Wounds /Fournier's gangrene        Subjective:   [name] cannot give any history  Objective:   Vital signs  Temperature 98.5 Heart rate is 72 Respiratory rate 26 Blood pressure 111/66 Pulse ox 100%    Exam  More alert still confused [place].AT,  Stiff Neck,No JVD, No cervical lymphadenopathy appriciated. Tracheostomy midline Symmetrical Chest wall movement, scattered rhonchi, Regular rate and rhythm ,No Gallops,Rubs systolic ejection murmur, No Parasternal Heave +ve B.Sounds, Abd Soft, generalized tenderness , No organomegaly appriciated, No rebound - guarding or rigidity. No Cyanosis, anasarca noted, multiple wounds reviewed   I&Os 1405/900    Data Review   CBC  Recent Labs Lab 12/01/13 0555 12/02/13 1120 12/03/13 0500 12/04/13 0500  WBC 8.7 9.5 8.9 10.2  HGB 7.0* 8.3* 8.2* 8.4*  HCT 22.5* 25.9* 25.8* 26.4*  PLT 104* 113* 112* 129*  MCV 105.1* 101.2* 102.4* 101.1*  MCH 32.7 32.4 32.5 32.2  MCHC 31.1 32.0 31.8 31.8  RDW 19.9* 22.1* 22.1* 21.5*    Chemistries   Recent Labs Lab 12/01/13 0555 12/04/13 0500  NA 141 140  K 4.3 4.2  CL 105 102  CO2 32 31  GLUCOSE 120* 139*  BUN 33* 40*  CREATININE 0.29* 0.29*  CALCIUM 8.2* 8.3*   ------------------------------------------------------------------------------------------------------------------ CrCl is unknown because there is no height on file for the current  visit. ------------------------------------------------------------------------------------------------------------------ No results found for this basename: HGBA1C,  in the last 72 hours ------------------------------------------------------------------------------------------------------------------ No results found for this basename: CHOL, HDL, LDLCALC, TRIG, CHOLHDL, LDLDIRECT,  in the last 72 hours ------------------------------------------------------------------------------------------------------------------ No results found for this basename: TSH, T4TOTAL, FREET3, T3FREE, THYROIDAB,  in the last 72 hours ------------------------------------------------------------------------------------------------------------------ No results found for this basename: VITAMINB12, FOLATE, FERRITIN, TIBC, IRON, RETICCTPCT,  in the last 72 hours  Coagulation profile No results found for this basename: INR, PROTIME,  in the last 168 hours  No results found for this basename: DDIMER,  in the last 72 hours  Cardiac Enzymes No results found for this basename: CK, CKMB, TROPONINI, MYOGLOBIN,  in the last 168 hours ------------------------------------------------------------------------------------------------------------------ No components found with this basename: POCBNP,   Micro Results No results found for this or any previous visit (from the past 240 hour(s)).     Assessment & Plan   VDRF, status post tracheostomy on 7/24; complicated by pulmonary hypertension and acute lung injury due to sepsis Continue ATC trials  Fournier's gangrene with involvement of the left perineum vulva right proximal medial thigh and groin status post multiple surgeries for debridement wound care team is following and wound VAC was  placed wounds look improved Multiple decubitus ulcers on the right thigh and left ischium wound care team is following ulcers look improved  protein calorie malnutrition/dysphagia status  post PEG placement Aspiration pneumonia we'll continue with IV antibiotics UTI on treatment Status resolved with coagulopathy DIC and anemia status post blood products transfusion A. fib with controlled rate History of congestive heart failure/diastolic with mitral regurgitation Obesity Metabolic encephalopathy Generalized weakness PT OT might be limited due to condition Free air under diaphragm probably due to PEG tube placement and multiple surgeries. Patient is asymptomatic. Not a surgical candidate at this time we'll follow up on an x-ray in a.m. Anemia  Plan  Continue same treatment Check labs in a.m.  Code Status: Full   DVT Prophylaxis  SCDs   Carron CurieHijazi, Tilda Samudio M.D on 12/07/2013 at 12:24 PM

## 2013-12-08 ENCOUNTER — Other Ambulatory Visit (HOSPITAL_COMMUNITY): Payer: Self-pay

## 2013-12-08 LAB — BASIC METABOLIC PANEL
Anion gap: 5 (ref 5–15)
BUN: 39 mg/dL — AB (ref 6–23)
CO2: 32 meq/L (ref 19–32)
Calcium: 8.1 mg/dL — ABNORMAL LOW (ref 8.4–10.5)
Chloride: 107 mEq/L (ref 96–112)
Creatinine, Ser: 0.28 mg/dL — ABNORMAL LOW (ref 0.50–1.10)
GFR calc Af Amer: >90 mL/min (ref >90)
GLUCOSE: 124 mg/dL — AB (ref 70–99)
Potassium: 4.3 mEq/L (ref 3.7–5.3)
SODIUM: 144 meq/L (ref 137–147)

## 2013-12-08 LAB — URINE MICROSCOPIC-ADD ON

## 2013-12-08 LAB — CBC
HEMATOCRIT: 26.3 % — AB (ref 36.0–46.0)
HEMOGLOBIN: 8.2 g/dL — AB (ref 12.0–15.0)
MCH: 33.2 pg (ref 26.0–34.0)
MCHC: 31.2 g/dL (ref 30.0–36.0)
MCV: 106.5 fL — ABNORMAL HIGH (ref 78.0–100.0)
Platelets: 106 10*3/uL — ABNORMAL LOW (ref 150–400)
RBC: 2.47 MIL/uL — AB (ref 3.87–5.11)
RDW: 21.4 % — ABNORMAL HIGH (ref 11.5–15.5)
WBC: 7.2 10*3/uL (ref 4.0–10.5)

## 2013-12-08 LAB — URINALYSIS, ROUTINE W REFLEX MICROSCOPIC
Bilirubin Urine: NEGATIVE
GLUCOSE, UA: NEGATIVE mg/dL
Ketones, ur: NEGATIVE mg/dL
Nitrite: NEGATIVE
PH: 7 (ref 5.0–8.0)
Protein, ur: 30 mg/dL — AB
Specific Gravity, Urine: 1.013 (ref 1.005–1.030)
Urobilinogen, UA: 2 mg/dL — ABNORMAL HIGH (ref 0.0–1.0)
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
## 2013-12-08 NOTE — Progress Notes (Signed)
[facility]                                                                                              Progress note     Patient Demographics  Angela Oneal, is a 80 y.o. female  ZOX:[id number]  WJX:[id number]  DOB - [birth year]  Admit date - 2013/12/25  Admitting Physician Carron Curie, MD  Outpatient Primary MD for the patient is No primary provider on file.  LOS - 12   Chief complaint Respiratory failure Protein calorie malnutrition Wounds /Fournier's gangrene        Subjective:   [name] cannot give any history  Objective:   Vital signs  Temperature 97.1 Heart rate is 77 Respiratory rate 30 Blood pressure 119/70 Pulse ox 100%    Exam  More alert still confused [place].AT,  Stiff Neck,No JVD, No cervical lymphadenopathy appriciated. Tracheostomy midline Symmetrical Chest wall movement, scattered rhonchi, Regular rate and rhythm ,No Gallops,Rubs systolic ejection murmur, No Parasternal Heave +ve B.Sounds, Abd Soft, generalized tenderness , No organomegaly appriciated, No rebound - guarding or rigidity. No Cyanosis, anasarca noted, multiple wounds reviewed   I&Os -106    Data Review   CBC  Recent Labs Lab 12/02/13 1120 12/03/13 0500 12/04/13 0500 12/08/13 0550  WBC 9.5 8.9 10.2 7.2  HGB 8.3* 8.2* 8.4* 8.2*  HCT 25.9* 25.8* 26.4* 26.3*  PLT 113* 112* 129* 106*  MCV 101.2* 102.4* 101.1* 106.5*  MCH 32.4 32.5 32.2 33.2  MCHC 32.0 31.8 31.8 31.2  RDW 22.1* 22.1* 21.5* 21.4*    Chemistries   Recent Labs Lab 12/04/13 0500 12/08/13 0550  NA 140 144  K 4.2 4.3  CL 102 107  CO2 31 32  GLUCOSE 139* 124*  BUN 40* 39*  CREATININE 0.29* 0.28*  CALCIUM 8.3* 8.1*   ------------------------------------------------------------------------------------------------------------------ CrCl is unknown because there is no height on file for the current  visit. ------------------------------------------------------------------------------------------------------------------ No results found for this basename: HGBA1C,  in the last 72 hours ------------------------------------------------------------------------------------------------------------------ No results found for this basename: CHOL, HDL, LDLCALC, TRIG, CHOLHDL, LDLDIRECT,  in the last 72 hours ------------------------------------------------------------------------------------------------------------------ No results found for this basename: TSH, T4TOTAL, FREET3, T3FREE, THYROIDAB,  in the last 72 hours ------------------------------------------------------------------------------------------------------------------ No results found for this basename: VITAMINB12, FOLATE, FERRITIN, TIBC, IRON, RETICCTPCT,  in the last 72 hours  Coagulation profile No results found for this basename: INR, PROTIME,  in the last 168 hours  No results found for this basename: DDIMER,  in the last 72 hours  Cardiac Enzymes No results found for this basename: CK, CKMB, TROPONINI, MYOGLOBIN,  in the last 168 hours ------------------------------------------------------------------------------------------------------------------ No components found with this basename: POCBNP,   Micro Results No results found for this or any previous visit (from the past 240 hour(s)).     Assessment & Plan   VDRF, status post tracheostomy on 7/24; complicated by pulmonary hypertension and acute lung injury due to sepsis Continue ATC trials  Fournier's gangrene with involvement of the left perineum vulva right proximal medial thigh and groin status post multiple surgeries for debridement wound care team is following and wound VAC was  placed wounds look improved Multiple decubitus ulcers on the right thigh and left ischium wound care team is following ulcers look improved  protein calorie malnutrition/dysphagia status  post PEG placement Aspiration pneumonia we'll continue with IV antibiotics UTI on treatment Status resolved with coagulopathy DIC and anemia status post blood products transfusion A. fib with controlled rate History of congestive heart failure/diastolic with mitral regurgitation Obesity Metabolic encephalopathy Generalized weakness PT OT might be limited due to condition Free air under diaphragm probably due to PEG tube placement and multiple surgeries. Patient is asymptomatic. Not a surgical candidate at this time we'll follow up on an x-ray in a.m. Anemia Pulmonary edema negative balance  Plan  Give Lasix extra dose 40 mg IV today  Code Status: Full   DVT Prophylaxis  SCDs   Carron CurieHijazi, Tiffane Sheldon M.D on 12/08/2013 at 10:09 AM

## 2013-12-09 NOTE — Progress Notes (Signed)
[facility]                                                                                              Progress note     Patient Demographics  Angela Oneal, is a 80 y.o. female  ZOX:[id number]  [id number]:[id number]  DOB - [birth year]  Admit date - 12/15/2013  Admitting Physician Carron Curie, MD  Outpatient Primary MD for the patient is No primary provider on file.  LOS - 13   Chief complaint Respiratory failure Protein calorie malnutrition Wounds /Fournier's gangrene        Subjective:   Angela Oneal cannot give any history  Objective:   Vital signs  Temperature 99.6 Heart rate is 94 Respiratory rate 27 Blood pressure 100/63 Pulse ox 100%    Exam  More alert still confused [name].AT,  Stiff Neck,No JVD, No cervical lymphadenopathy appriciated. Tracheostomy midline Symmetrical Chest wall movement, scattered rhonchi, Regular rate and rhythm ,No Gallops,Rubs systolic ejection murmur, No Parasternal Heave +ve B.Sounds, Abd Soft, generalized tenderness , No organomegaly appriciated, No rebound - guarding or rigidity. No Cyanosis, anasarca noted, multiple wounds reviewed   I&Os - 38    Data Review   CBC  Recent Labs Lab 12/03/13 0500 12/04/13 0500 12/08/13 0550  WBC 8.9 10.2 7.2  HGB 8.2* 8.4* 8.2*  HCT 25.8* 26.4* 26.3*  PLT 112* 129* 106*  MCV 102.4* 101.1* 106.5*  MCH 32.5 32.2 33.2  MCHC 31.8 31.8 31.2  RDW 22.1* 21.5* 21.4*    Chemistries   Recent Labs Lab 12/04/13 0500 12/08/13 0550  NA 140 144  K 4.2 4.3  CL 102 107  CO2 31 32  GLUCOSE 139* 124*  BUN 40* 39*  CREATININE 0.29* 0.28*  CALCIUM 8.3* 8.1*   ------------------------------------------------------------------------------------------------------------------ CrCl is unknown because there is no height on file for the current  visit. ------------------------------------------------------------------------------------------------------------------ No results found for this basename: HGBA1C,  in the last 72 hours ------------------------------------------------------------------------------------------------------------------ No results found for this basename: CHOL, HDL, LDLCALC, TRIG, CHOLHDL, LDLDIRECT,  in the last 72 hours ------------------------------------------------------------------------------------------------------------------ No results found for this basename: TSH, T4TOTAL, FREET3, T3FREE, THYROIDAB,  in the last 72 hours ------------------------------------------------------------------------------------------------------------------ No results found for this basename: VITAMINB12, FOLATE, FERRITIN, TIBC, IRON, RETICCTPCT,  in the last 72 hours  Coagulation profile No results found for this basename: INR, PROTIME,  in the last 168 hours  No results found for this basename: DDIMER,  in the last 72 hours  Cardiac Enzymes No results found for this basename: CK, CKMB, TROPONINI, MYOGLOBIN,  in the last 168 hours ------------------------------------------------------------------------------------------------------------------ No components found with this basename: POCBNP,   Micro Results Recent Results (from the past 240 hour(s))  URINE CULTURE     Status: None   Collection Time    12/08/13  1:03 PM      Result Value Ref Range Status   Specimen Description URINE, RANDOM   Final   Special Requests NONE   Final   Culture  Setup Time     Final   Value: 12/08/2013 17:07     Performed at [facility] Count  Final   Value: 55,000 COLONIES/ML     Performed at [facility]   Culture     Final   Value: GRAM NEGATIVE RODS     Performed at [facility]   Report Status PENDING   Incomplete  CULTURE, BLOOD (ROUTINE X 2)     Status: None   Collection Time    12/08/13   2:50 PM      Result Value Ref Range Status   Specimen Description BLOOD THUMB LEFT   Final   Special Requests BOTTLES DRAWN AEROBIC ONLY 3CC   Final   Culture  Setup Time     Final   Value: 12/08/2013 19:02     Performed at [facility]   Culture     Final   Value: GRAM NEGATIVE RODS     Note: Gram Stain Report Called to,Read Back By and Verified With: ASHLEY@9 :50AM ON 12/09/13 BY DANTS     Performed at [facility]   Report Status PENDING   Incomplete  CULTURE, BLOOD (ROUTINE X 2)     Status: None   Collection Time    12/08/13  3:00 PM      Result Value Ref Range Status   Specimen Description BLOOD LEFT HAND   Final   Special Requests BOTTLES DRAWN AEROBIC ONLY 5CC   Final   Culture  Setup Time     Final   Value: 12/08/2013 19:03     Performed at [facility]   Culture     Final   Value: GRAM NEGATIVE RODS     Note: Gram Stain Report Called to,Read Back By and Verified With: ASHLEY@9 :50AM ON 12/09/13 BY DANTS     Performed at [facility]   Report Status PENDING   Incomplete       Assessment & Plan   VDRF, status post tracheostomy on 7/24; complicated by pulmonary hypertension and acute lung injury due to sepsis Had a setback yesterday and is on PSV weaning again due to congestive heart failure and fever Fever with negative chest x-ray for infection and negative urinalysis back on IV antibiotics for SIRS Fournier's gangrene with involvement of the left perineum vulva right proximal medial thigh and groin status post multiple surgeries for debridement wound care team is following and wound VAC was placed wounds look improved Multiple decubitus ulcers on the right thigh and left ischium wound care team is following ulcers look improved  protein calorie malnutrition/dysphagia status post PEG placement Aspiration pneumonia treated UTI treated Status resolved with coagulopathy DIC and anemia status post blood products transfusion A. fib with  controlled rate History of congestive heart failure/diastolic with mitral regurgitation Obesity Metabolic encephalopathy Generalized weakness PT OT might be limited due  condition Free air under diaphragm probably due to PEG tube placement and multiple surgeries. Patient is asymptomatic. Not a surgical candidate at this time we'll follow up on an x-ray in a.m. Anemia Pulmonary edema negative balance  Plan  Awaiting consultation with infectious disease today Continue with IV Rocephin and Flagyl until then Critical care time 36 minutes Code Status: Full   DVT Prophylaxis  SCDs   Carron CurieHijazi, Hughey Rittenberry M.D on 12/09/2013 at 2:18 PM

## 2013-12-10 ENCOUNTER — Other Ambulatory Visit (HOSPITAL_COMMUNITY): Payer: Self-pay

## 2013-12-10 LAB — CBC
HCT: 25.2 % — ABNORMAL LOW (ref 36.0–46.0)
HEMOGLOBIN: 7.9 g/dL — AB (ref 12.0–15.0)
MCH: 32 pg (ref 26.0–34.0)
MCHC: 31.3 g/dL (ref 30.0–36.0)
MCV: 102 fL — AB (ref 78.0–100.0)
Platelets: 78 10*3/uL — ABNORMAL LOW (ref 150–400)
RBC: 2.47 MIL/uL — AB (ref 3.87–5.11)
RDW: 21 % — ABNORMAL HIGH (ref 11.5–15.5)
WBC: 1.1 10*3/uL — CL (ref 4.0–10.5)

## 2013-12-10 LAB — BASIC METABOLIC PANEL
Anion gap: 13 (ref 5–15)
BUN: 53 mg/dL — ABNORMAL HIGH (ref 6–23)
CALCIUM: 8.2 mg/dL — AB (ref 8.4–10.5)
CO2: 27 mEq/L (ref 19–32)
Chloride: 106 mEq/L (ref 96–112)
Creatinine, Ser: 0.45 mg/dL — ABNORMAL LOW (ref 0.50–1.10)
GFR calc Af Amer: >90 mL/min (ref >90)
GFR calc non Af Amer: >90 mL/min (ref >90)
Glucose, Bld: 47 mg/dL — ABNORMAL LOW (ref 70–99)
Potassium: 3.4 mEq/L — ABNORMAL LOW (ref 3.7–5.3)
SODIUM: 146 meq/L (ref 137–147)

## 2013-12-10 LAB — PREALBUMIN: Prealbumin: 4.1 mg/dL — ABNORMAL LOW (ref 17.0–34.0)

## 2013-12-10 LAB — PROCALCITONIN: Procalcitonin: 24.51 ng/mL

## 2013-12-11 ENCOUNTER — Other Ambulatory Visit (HOSPITAL_COMMUNITY): Payer: Self-pay

## 2013-12-11 LAB — URINE CULTURE: Colony Count: 55000

## 2013-12-11 LAB — CULTURE, BLOOD (ROUTINE X 2)

## 2013-12-11 LAB — BASIC METABOLIC PANEL
Anion gap: 19 — ABNORMAL HIGH (ref 5–15)
BUN: 66 mg/dL — ABNORMAL HIGH (ref 6–23)
CHLORIDE: 102 meq/L (ref 96–112)
CO2: 17 mEq/L — ABNORMAL LOW (ref 19–32)
CREATININE: 0.57 mg/dL (ref 0.50–1.10)
Calcium: 7.1 mg/dL — ABNORMAL LOW (ref 8.4–10.5)
GFR calc Af Amer: >90 mL/min (ref >90)
GFR calc non Af Amer: 85 mL/min — ABNORMAL LOW (ref >90)
Glucose, Bld: 54 mg/dL — ABNORMAL LOW (ref 70–99)
Potassium: 4.3 mEq/L (ref 3.7–5.3)
Sodium: 138 mEq/L (ref 137–147)
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
## 2013-12-12 LAB — CULTURE, BLOOD (ROUTINE X 2)

## 2013-12-14 LAB — CULTURE, BLOOD (ROUTINE X 2)

## 2013-12-30 DEATH — deceased

## 2015-04-24 IMAGING — CR DG ABD PORTABLE 1V
1 series · 1 of 1 positions shown · non-contrast
Comparison: 11/28/2013.

CLINICAL DATA: Checking location of a gastrostomy tube.

EXAM:
PORTABLE ABDOMEN - 1 VIEW

[AP]
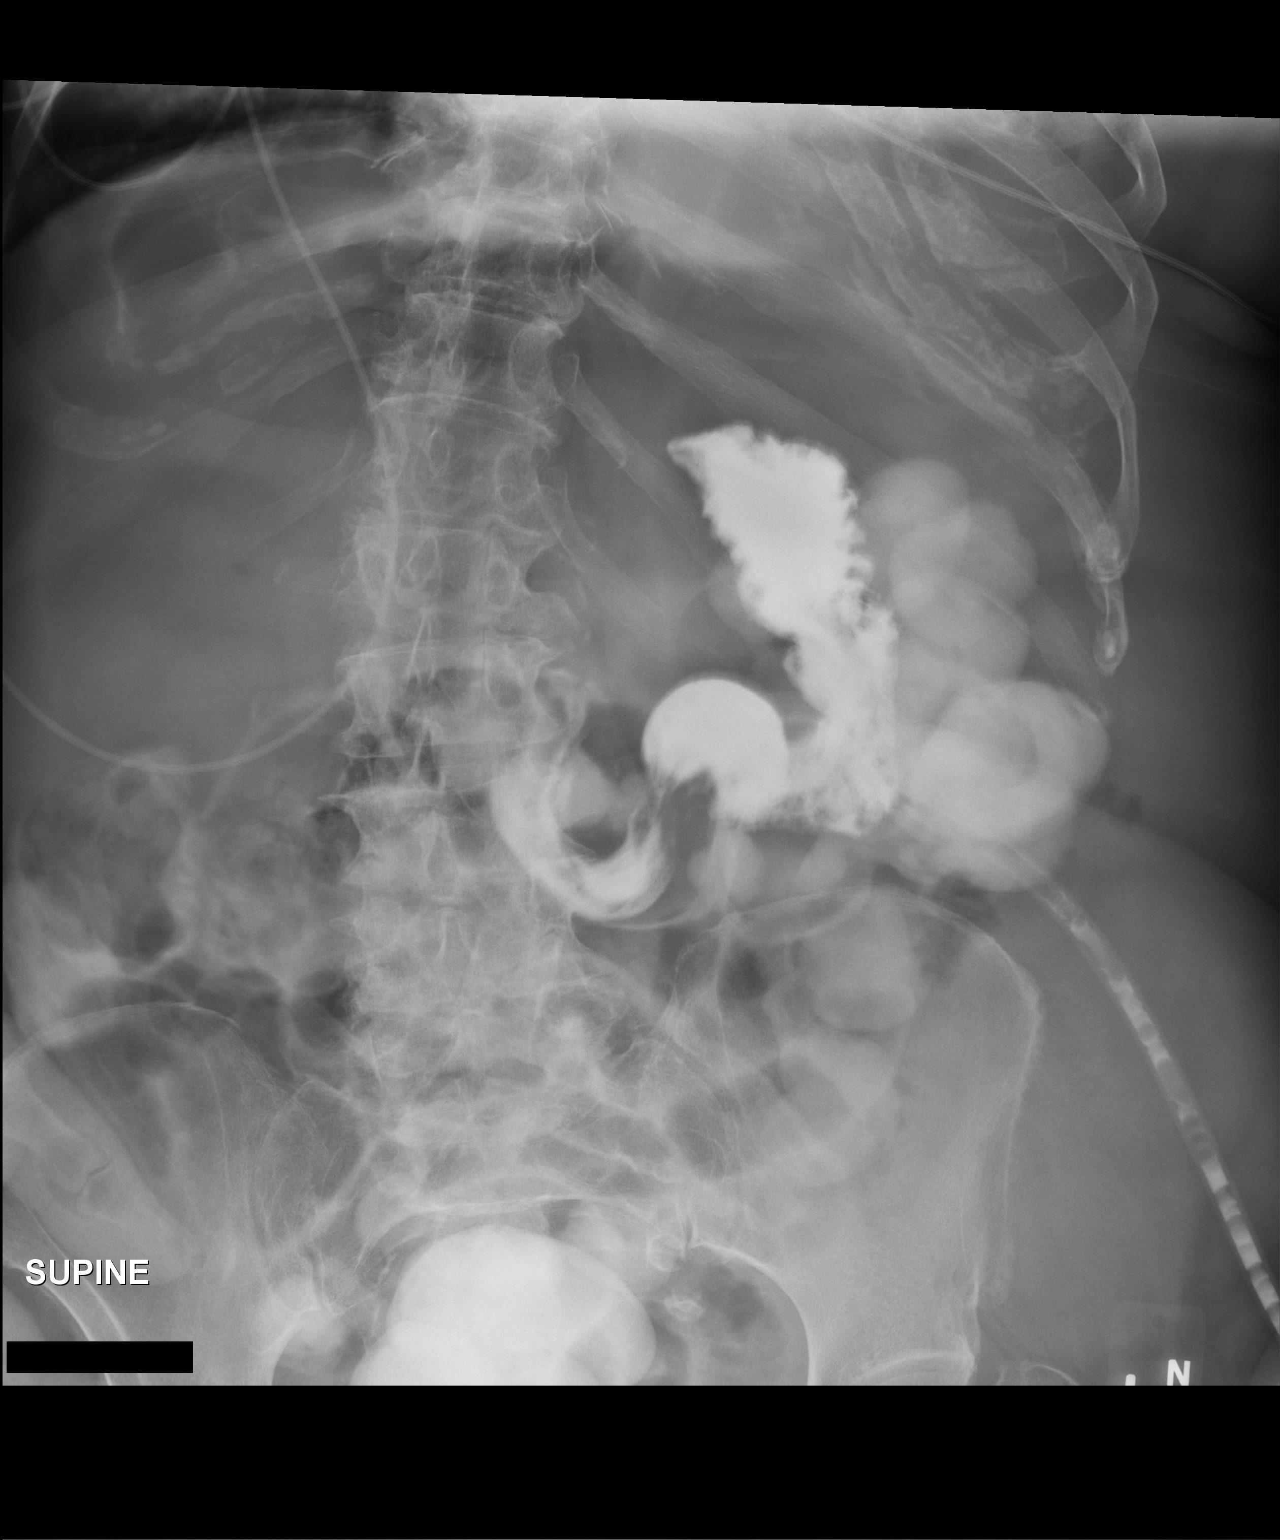

[1 of 1 positions shown; findings below may reference images not displayed]

FINDINGS: There is injected contrast in the gastrostomy tube and in the
stomach. Adjacent more dilute colonic contrast is unchanged. Normal
bowel gas pattern. A large amount of free peritoneal air is again
demonstrated in the upper abdomen. This was initially reported on
11/27/2013.
IMPRESSION: 1. Continued large amount of free peritoneal air suspicious for
bowel perforation.
2. Gastrostomy tube in satisfactory position.
These results will be called to the ordering clinician or
representative by the Radiologist Assistant, and communication
documented in the PACS or zVision Dashboard.

## 2015-04-28 IMAGING — CR DG ABD PORTABLE 1V
1 series · 1 of 1 positions shown · non-contrast
Comparison: November 29, 2013

CLINICAL DATA: Sepsis and constipation

EXAM:
PORTABLE ABDOMEN - 1 VIEW

[AP]
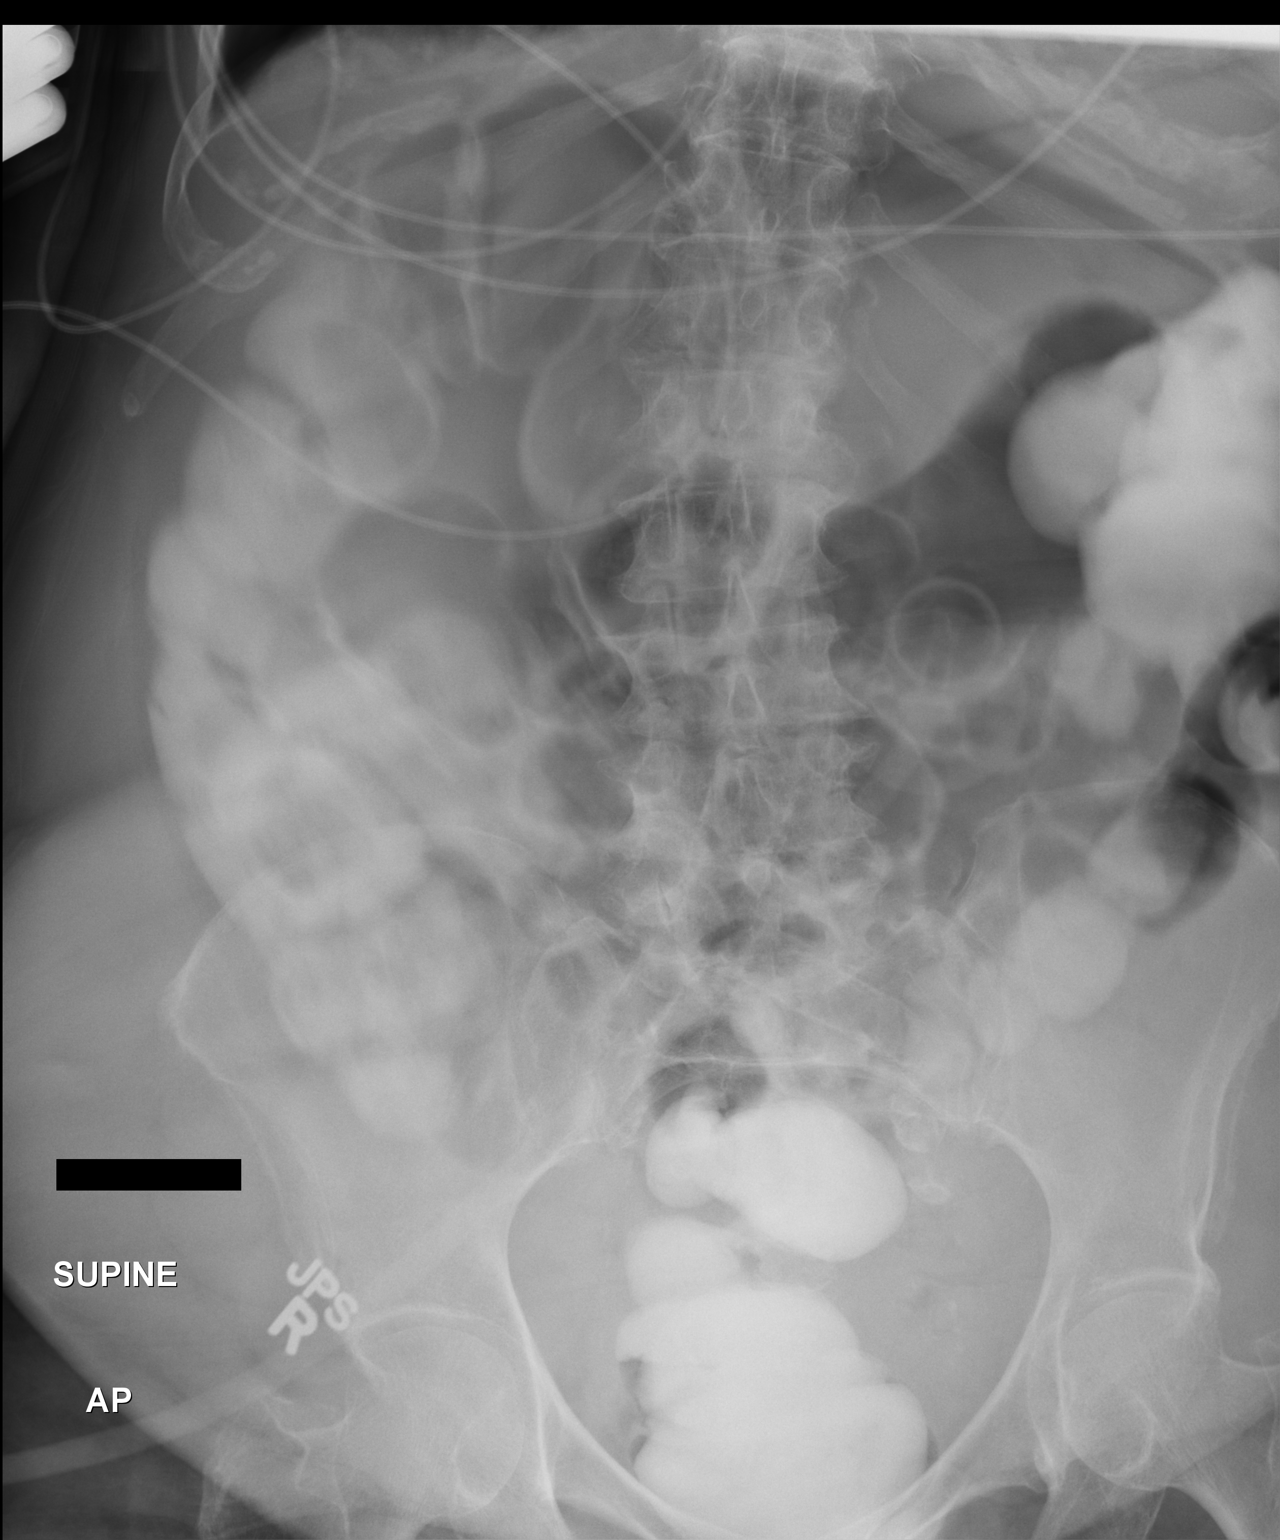

[1 of 1 positions shown; findings below may reference images not displayed]

FINDINGS: There is moderate contrast throughout the colon. The overall bowel
gas pattern is unremarkable. No obstruction or free air is seen on
this supine examination. There is a gastrostomy catheter in the
expected location of the stomach.
IMPRESSION: No obstruction or free air is seen on this supine examination.
Moderate contrast throughout the colon.

## 2015-05-05 IMAGING — CR DG CHEST 1V PORT
1 series · 1 of 1 positions shown · non-contrast
Comparison: Portable chest x-ray December 08, 2013

CLINICAL DATA: Status post right-sided PICC line placement

EXAM:
PORTABLE CHEST - 1 VIEW

[AP]
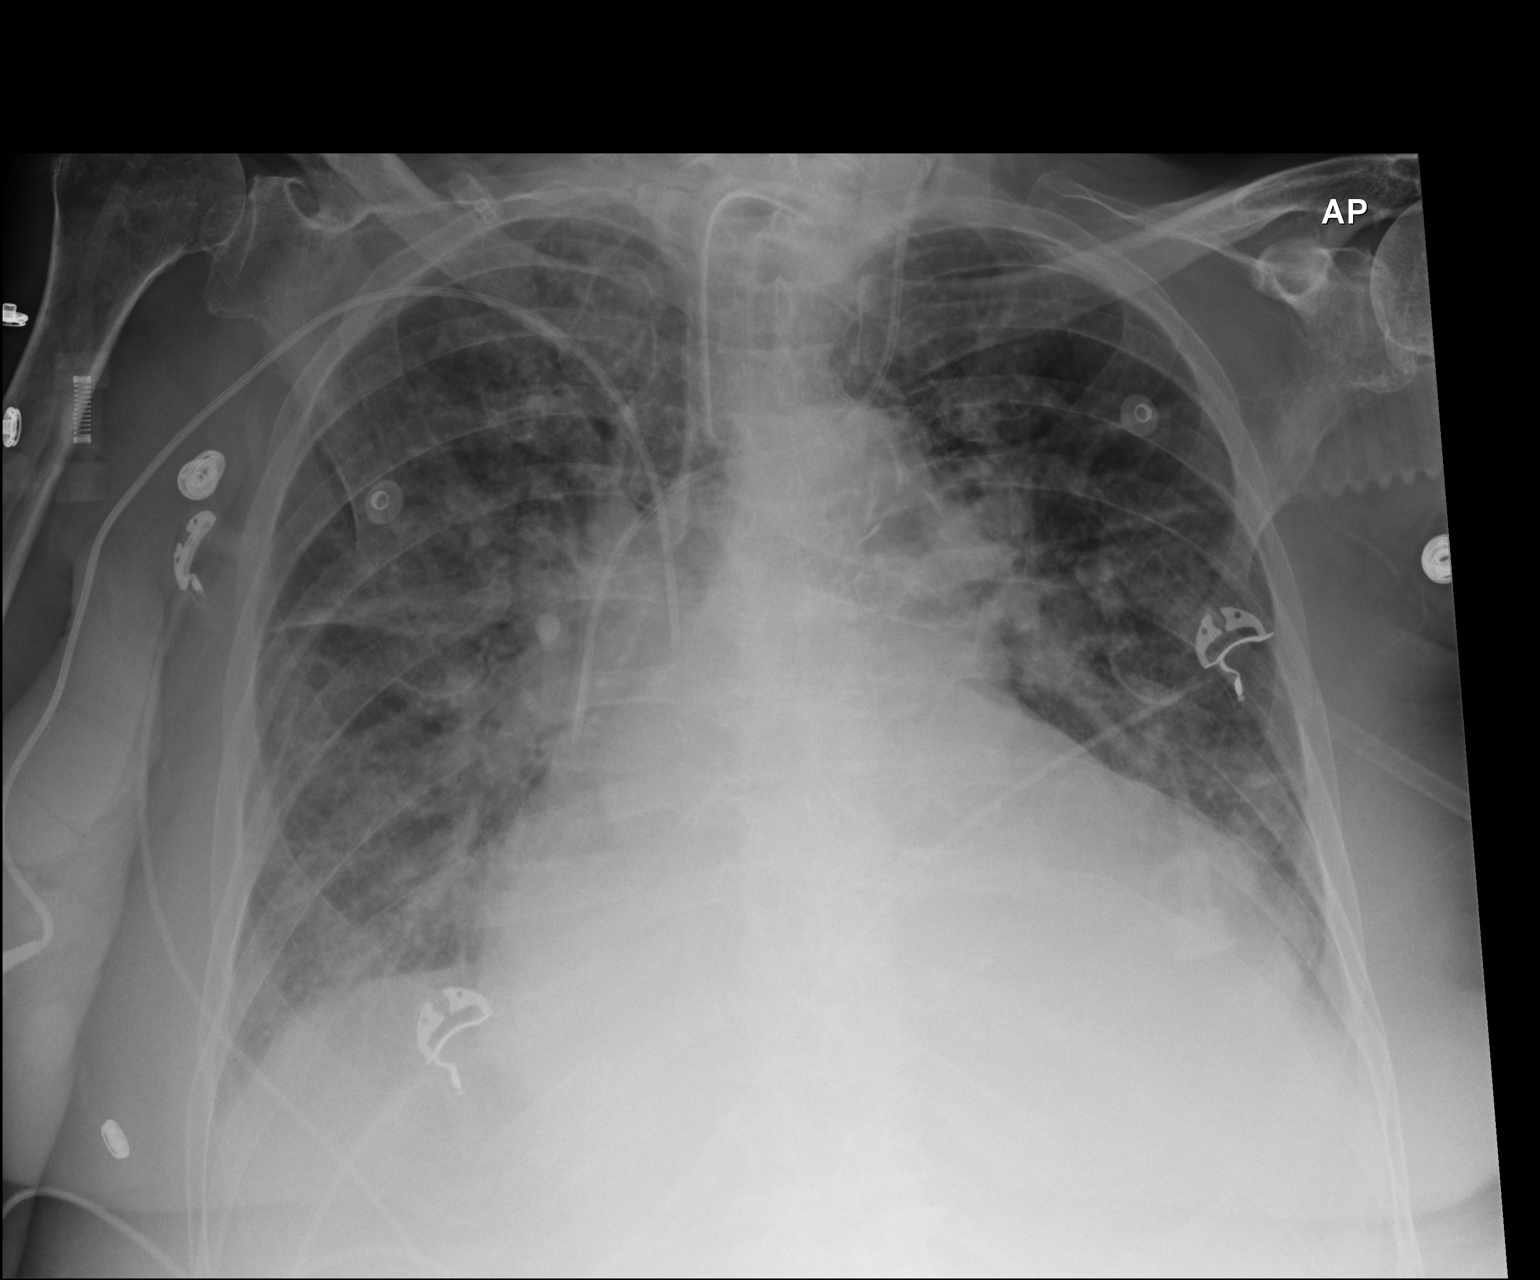

[1 of 1 positions shown; findings below may reference images not displayed]

FINDINGS: The PICC line tip lies in the midportion of the SVC. There is no
postprocedure complication. The left internal jugular venous
catheter tip lies slightly more distally in the midportion of the
SVC. The tracheostomy appliance tip lies 3.4 cm above the crotch of
the carina. The cardiac silhouette remains enlarged. The pulmonary
interstitial markings remain increased but have improved. The
retrocardiac region on the left remains dense. There is chronic
degenerative change of the right shoulder.
IMPRESSION: There is no postprocedure complication following placement of the
PICC line on the right.
